# Patient Record
Sex: Male | Born: 1975 | Race: White | Hispanic: No | Marital: Married | Smoking: Current some day smoker
Health system: Southern US, Community
[De-identification: ages and names within clinical notes are randomized; demographics above are authoritative.]

## PROBLEM LIST (undated history)

## (undated) ENCOUNTER — Emergency Department (HOSPITAL_COMMUNITY): Payer: 59

## (undated) DIAGNOSIS — I10 Essential (primary) hypertension: Secondary | ICD-10-CM

---

## 2018-07-30 ENCOUNTER — Emergency Department (HOSPITAL_BASED_OUTPATIENT_CLINIC_OR_DEPARTMENT_OTHER)
Admission: EM | Admit: 2018-07-30 | Discharge: 2018-07-30 | Disposition: A | Discharge status: Home / Self Care | Payer: 59 | Attending: Emergency Medicine | Admitting: Emergency Medicine

## 2018-07-30 ENCOUNTER — Other Ambulatory Visit: Payer: Self-pay

## 2018-07-30 ENCOUNTER — Encounter (HOSPITAL_BASED_OUTPATIENT_CLINIC_OR_DEPARTMENT_OTHER): Payer: Self-pay | Admitting: *Deleted

## 2018-07-30 ENCOUNTER — Emergency Department (HOSPITAL_BASED_OUTPATIENT_CLINIC_OR_DEPARTMENT_OTHER): Payer: 59

## 2018-07-30 DIAGNOSIS — F17200 Nicotine dependence, unspecified, uncomplicated: Secondary | ICD-10-CM | POA: Insufficient documentation

## 2018-07-30 DIAGNOSIS — Z79899 Other long term (current) drug therapy: Secondary | ICD-10-CM | POA: Diagnosis not present

## 2018-07-30 DIAGNOSIS — B349 Viral infection, unspecified: Secondary | ICD-10-CM | POA: Diagnosis not present

## 2018-07-30 DIAGNOSIS — I10 Essential (primary) hypertension: Secondary | ICD-10-CM | POA: Diagnosis not present

## 2018-07-30 DIAGNOSIS — R112 Nausea with vomiting, unspecified: Secondary | ICD-10-CM | POA: Diagnosis present

## 2018-07-30 HISTORY — DX: Essential (primary) hypertension: I10

## 2018-07-30 LAB — CBC WITH DIFFERENTIAL/PLATELET
BASOS PCT: 0 %
Basophils Absolute: 0 10*3/uL (ref 0.0–0.1)
EOS ABS: 0.1 10*3/uL (ref 0.0–0.7)
Eosinophils Relative: 1 %
HEMATOCRIT: 43 % (ref 39.0–52.0)
Hemoglobin: 15.7 g/dL (ref 13.0–17.0)
LYMPHS ABS: 2.5 10*3/uL (ref 0.7–4.0)
Lymphocytes Relative: 20 %
MCH: 30.6 pg (ref 26.0–34.0)
MCHC: 36.5 g/dL — AB (ref 30.0–36.0)
MCV: 83.8 fL (ref 78.0–100.0)
MONO ABS: 0.7 10*3/uL (ref 0.1–1.0)
MONOS PCT: 5 %
NEUTROS ABS: 9.5 10*3/uL — AB (ref 1.7–7.7)
Neutrophils Relative %: 74 %
Platelets: 197 10*3/uL (ref 150–400)
RBC: 5.13 MIL/uL (ref 4.22–5.81)
RDW: 12.3 % (ref 11.5–15.5)
WBC: 12.8 10*3/uL — ABNORMAL HIGH (ref 4.0–10.5)

## 2018-07-30 LAB — COMPREHENSIVE METABOLIC PANEL
ALBUMIN: 4.6 g/dL (ref 3.5–5.0)
ALT: 35 U/L (ref 0–44)
AST: 24 U/L (ref 15–41)
Alkaline Phosphatase: 52 U/L (ref 38–126)
Anion gap: 10 (ref 5–15)
BUN: 14 mg/dL (ref 6–20)
CO2: 28 mmol/L (ref 22–32)
CREATININE: 0.86 mg/dL (ref 0.61–1.24)
Calcium: 9.1 mg/dL (ref 8.9–10.3)
Chloride: 99 mmol/L (ref 98–111)
GFR calc Af Amer: >60 mL/min (ref >60)
GFR calc non Af Amer: >60 mL/min (ref >60)
GLUCOSE: 89 mg/dL (ref 70–99)
Potassium: 4 mmol/L (ref 3.5–5.1)
Sodium: 137 mmol/L (ref 135–145)
Total Bilirubin: 0.9 mg/dL (ref 0.3–1.2)
Total Protein: 7.5 g/dL (ref 6.5–8.1)

## 2018-07-30 LAB — URINALYSIS, ROUTINE W REFLEX MICROSCOPIC
Bilirubin Urine: NEGATIVE
Glucose, UA: NEGATIVE mg/dL
HGB URINE DIPSTICK: NEGATIVE
KETONES UR: NEGATIVE mg/dL
Leukocytes, UA: NEGATIVE
NITRITE: NEGATIVE
PH: 7 (ref 5.0–8.0)
Protein, ur: NEGATIVE mg/dL
Specific Gravity, Urine: 1.015 (ref 1.005–1.030)

## 2018-07-30 LAB — I-STAT CG4 LACTIC ACID, ED: Lactic Acid, Venous: 0.82 mmol/L (ref 0.5–1.9)

## 2018-07-30 MED ORDER — HYDROCODONE-ACETAMINOPHEN 5-325 MG PO TABS: 1.0000 | ORAL_TABLET | Freq: Three times a day (TID) | ORAL | 0 refills | Status: DC | PRN

## 2018-07-30 MED ORDER — DOXYCYCLINE HYCLATE 100 MG PO CAPS: 100.0000 mg | ORAL_CAPSULE | Freq: Two times a day (BID) | ORAL | 0 refills | Status: AC

## 2018-07-30 MED ORDER — TRAMADOL HCL 50 MG PO TABS: 50.0000 mg | ORAL_TABLET | Freq: Three times a day (TID) | ORAL | 0 refills | Status: AC | PRN

## 2018-07-30 MED ORDER — KETOROLAC TROMETHAMINE 30 MG/ML IJ SOLN
30.0000 mg | Freq: Once | INTRAMUSCULAR | Status: AC
  Administered 2018-07-30: 30 mg via INTRAVENOUS
  Filled 2018-07-30: qty 1

## 2018-07-30 MED ORDER — DOXYCYCLINE HYCLATE 100 MG PO CAPS: 100.0000 mg | ORAL_CAPSULE | Freq: Two times a day (BID) | ORAL | 0 refills | Status: DC

## 2018-07-30 MED ORDER — SODIUM CHLORIDE 0.9 % IV BOLUS
500.0000 mL | Freq: Once | INTRAVENOUS | Status: AC
  Administered 2018-07-30: 500 mL via INTRAVENOUS

## 2018-07-30 NOTE — ED Provider Notes (Signed)
[facility] EMERGENCY DEPARTMENT Provider Note   CSN: [id number] Arrival date & time: 07/30/18  1716     History   Chief Complaint Chief Complaint  Patient presents with  . Generalized Body Aches    HPI David Osborne is a 42 y.o. male.  Patient reports generalized body aches began yesterday, along with feeling hot with chills. Diarrhea last night.  Nausea today, with two episodes of vomiting this morning. Mild headache.  He also reports getting bit on the lower legs by unknown/unseen insect after putting on a pair of long pants.   The history is provided by the patient. No language interpreter was used.  Illness  This is a new problem. The current episode started yesterday. The problem has been gradually worsening. Associated symptoms include headaches. Pertinent negatives include no abdominal pain. Treatments tried: NSAID. The treatment provided no relief.    Past Medical History:  Diagnosis Date  . Hypertension     There are no active problems to display for this patient.   History reviewed. No pertinent surgical history.      Home Medications    Prior to Admission medications   Medication Sig Start Date End Date Taking? Authorizing Provider  LISINOPRIL PO Take by mouth.   Yes [provider]    Family History No family history on file.  Social History Social History   Tobacco Use  . Smoking status: Current Some Day Smoker  . Smokeless tobacco: Never Used  Substance Use Topics  . Alcohol use: Yes  . Drug use: Never     Allergies   Patient has no known allergies.   Review of Systems Review of Systems  Gastrointestinal: Negative for abdominal pain.  Musculoskeletal: Positive for arthralgias and myalgias.  Neurological: Positive for headaches.  All other systems reviewed and are negative.    Physical Exam Updated Vital Signs BP (!) 174/110 (BP Location: Right Arm)   Pulse 66   Temp 98.1 F (36.7 C) (Oral)   Resp 18    Ht 5\' 5"  (1.651 m)   Wt 83.5 kg   SpO2 99%   BMI 30.62 kg/m   Physical Exam  Constitutional: He is oriented to person, place, and time. He appears well-developed and well-nourished.  HENT:  Head: Normocephalic.  Eyes: Conjunctivae are normal.  Neck: Neck supple.  Cardiovascular: Normal rate and regular rhythm.  Pulmonary/Chest: Effort normal and breath sounds normal.  Abdominal: Soft. Bowel sounds are normal.  Musculoskeletal: Normal range of motion.  Neurological: He is alert and oriented to person, place, and time.  Skin: Skin is warm and dry. Rash noted. Rash is papular. There is erythema.  Multiple areas of papular lesions with erythema of lower extremities, below knees, that are pruritic.   Nursing note and vitals reviewed.    ED Treatments / Results  Labs (all labs ordered are listed, but only abnormal results are displayed) Labs Reviewed  CBC WITH DIFFERENTIAL/PLATELET - Abnormal; Notable for the following components:      Result Value   WBC 12.8 (*)    MCHC 36.5 (*)    Neutro Abs 9.5 (*)    All other components within normal limits  URINALYSIS, ROUTINE W REFLEX MICROSCOPIC  COMPREHENSIVE METABOLIC PANEL  I-STAT CG4 LACTIC ACID, ED    EKG None  Radiology No results found.  Procedures Procedures (including critical care time)  Medications Ordered in ED Medications - No data to display   Initial Impression / Assessment and Plan / ED Course  I have reviewed the triage vital signs and the nursing notes.  Pertinent labs & imaging results that were available during my care of the patient were reviewed by me and considered in my medical decision making (see chart for details).     Pt symptoms consistent with viral illness. Pt will be discharged with symptomatic treatment. Will also cover for potential tick borne disease with doxycycline. No current neurologic findings. No indication of sepsis. Discussed return precautions.  Pt is hemodynamically stable & in  NAD prior to discharge.  [place] Narcotic database consulted--patient not found in system.  Patient noted to be hypertensive in the emergency department.  No signs of hypertensive urgency.  Discussed with patient the need for close follow-up and management by their primary care physician.  Final Clinical Impressions(s) / ED Diagnoses   Final diagnoses:  Viral illness    ED Discharge Orders         Ordered    doxycycline (VIBRAMYCIN) 100 MG capsule  2 times daily,   Status:  Discontinued     07/30/18 2010    HYDROcodone-acetaminophen (NORCO) 5-325 MG tablet  Every 8 hours PRN,   Status:  Discontinued     07/30/18 2010    doxycycline (VIBRAMYCIN) 100 MG capsule  2 times daily     07/30/18 2046    traMADol (ULTRAM) 50 MG tablet  Every 8 hours PRN     07/30/18 2046           Felicie MornSmith, Kawanna Christley, NP 07/31/18 0011    Tilden Fossaees, Elizabeth, MD 08/01/18 1520

## 2018-07-30 NOTE — Discharge Instructions (Addendum)
Combine tylenol, 1000 mg, with ibuprofen, 600 mg, every 8 hours for pain/discomfort. You also have a prescription for tramadol--reserve this medication for pain not controlled with the combination therapy. You have also been started on doxycycline, 100 mg two times per day, for potential exposure to tick borne illness.

## 2018-07-30 NOTE — ED Notes (Signed)
Pt. Reports feeling pain in joints and muscles yesterday with vomiting and diarrhea yesterday last night into early this morning.  Pt. Reports rash around his ankles today and some itching in the ankle area.  noteded some red rash around the ankles bilat.  Pt. Is staying at a hotel and reports no visual seeing of bugs.

## 2018-07-30 NOTE — ED Triage Notes (Addendum)
Generalized body aches x 2 days. Last night he had chills and fever. Headache. Diarrhea last night. Vomited x 2 this am. Nausea.  He is here working. Last night he got several mosquito bites and bug bites on his legs.

## 2020-01-07 IMAGING — DX DG CHEST 2V
2 series · 2 of 2 positions shown · non-contrast
Comparison: None.

CLINICAL DATA: Fever and body aches

EXAM:
CHEST - 2 VIEW

[chest pa]
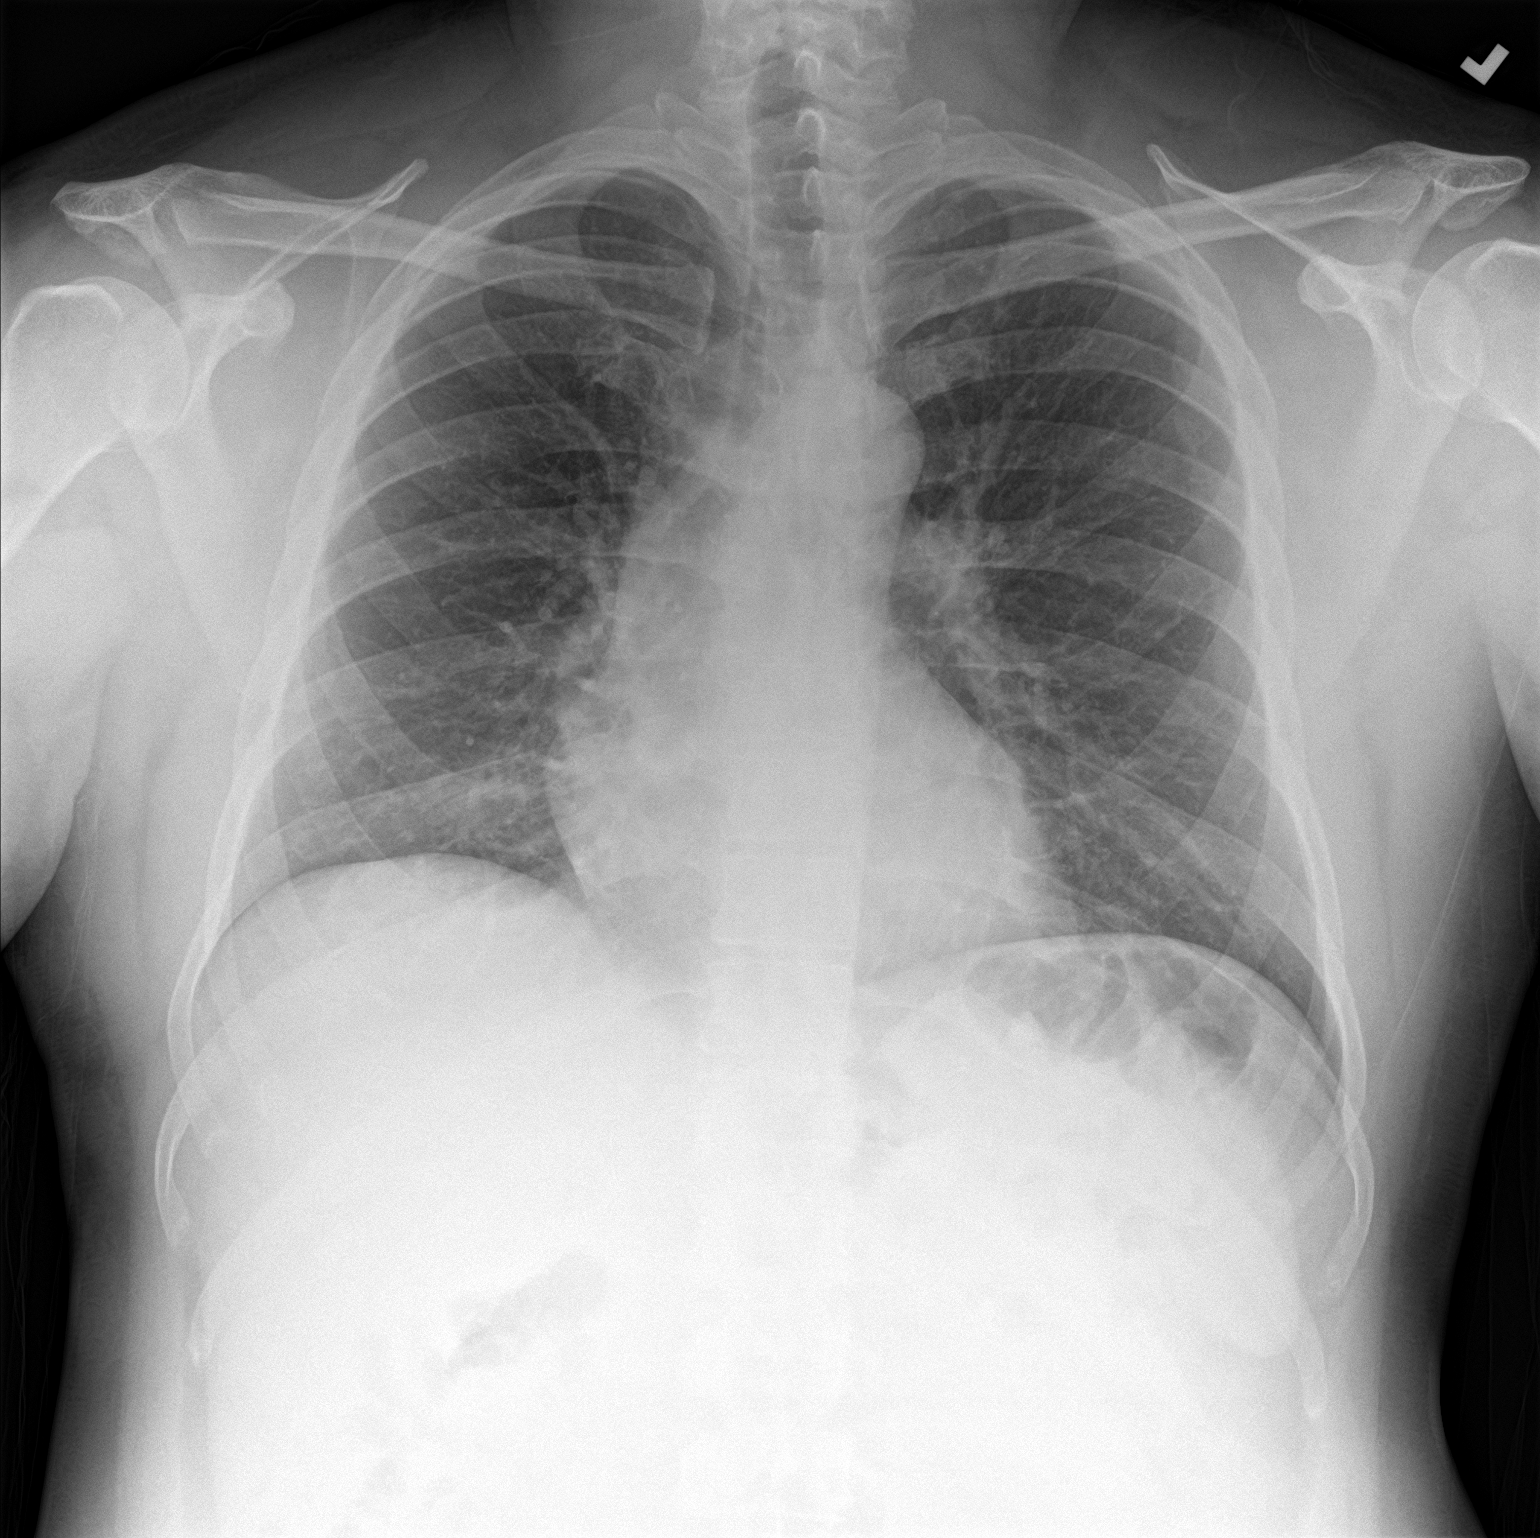

[chest lat]
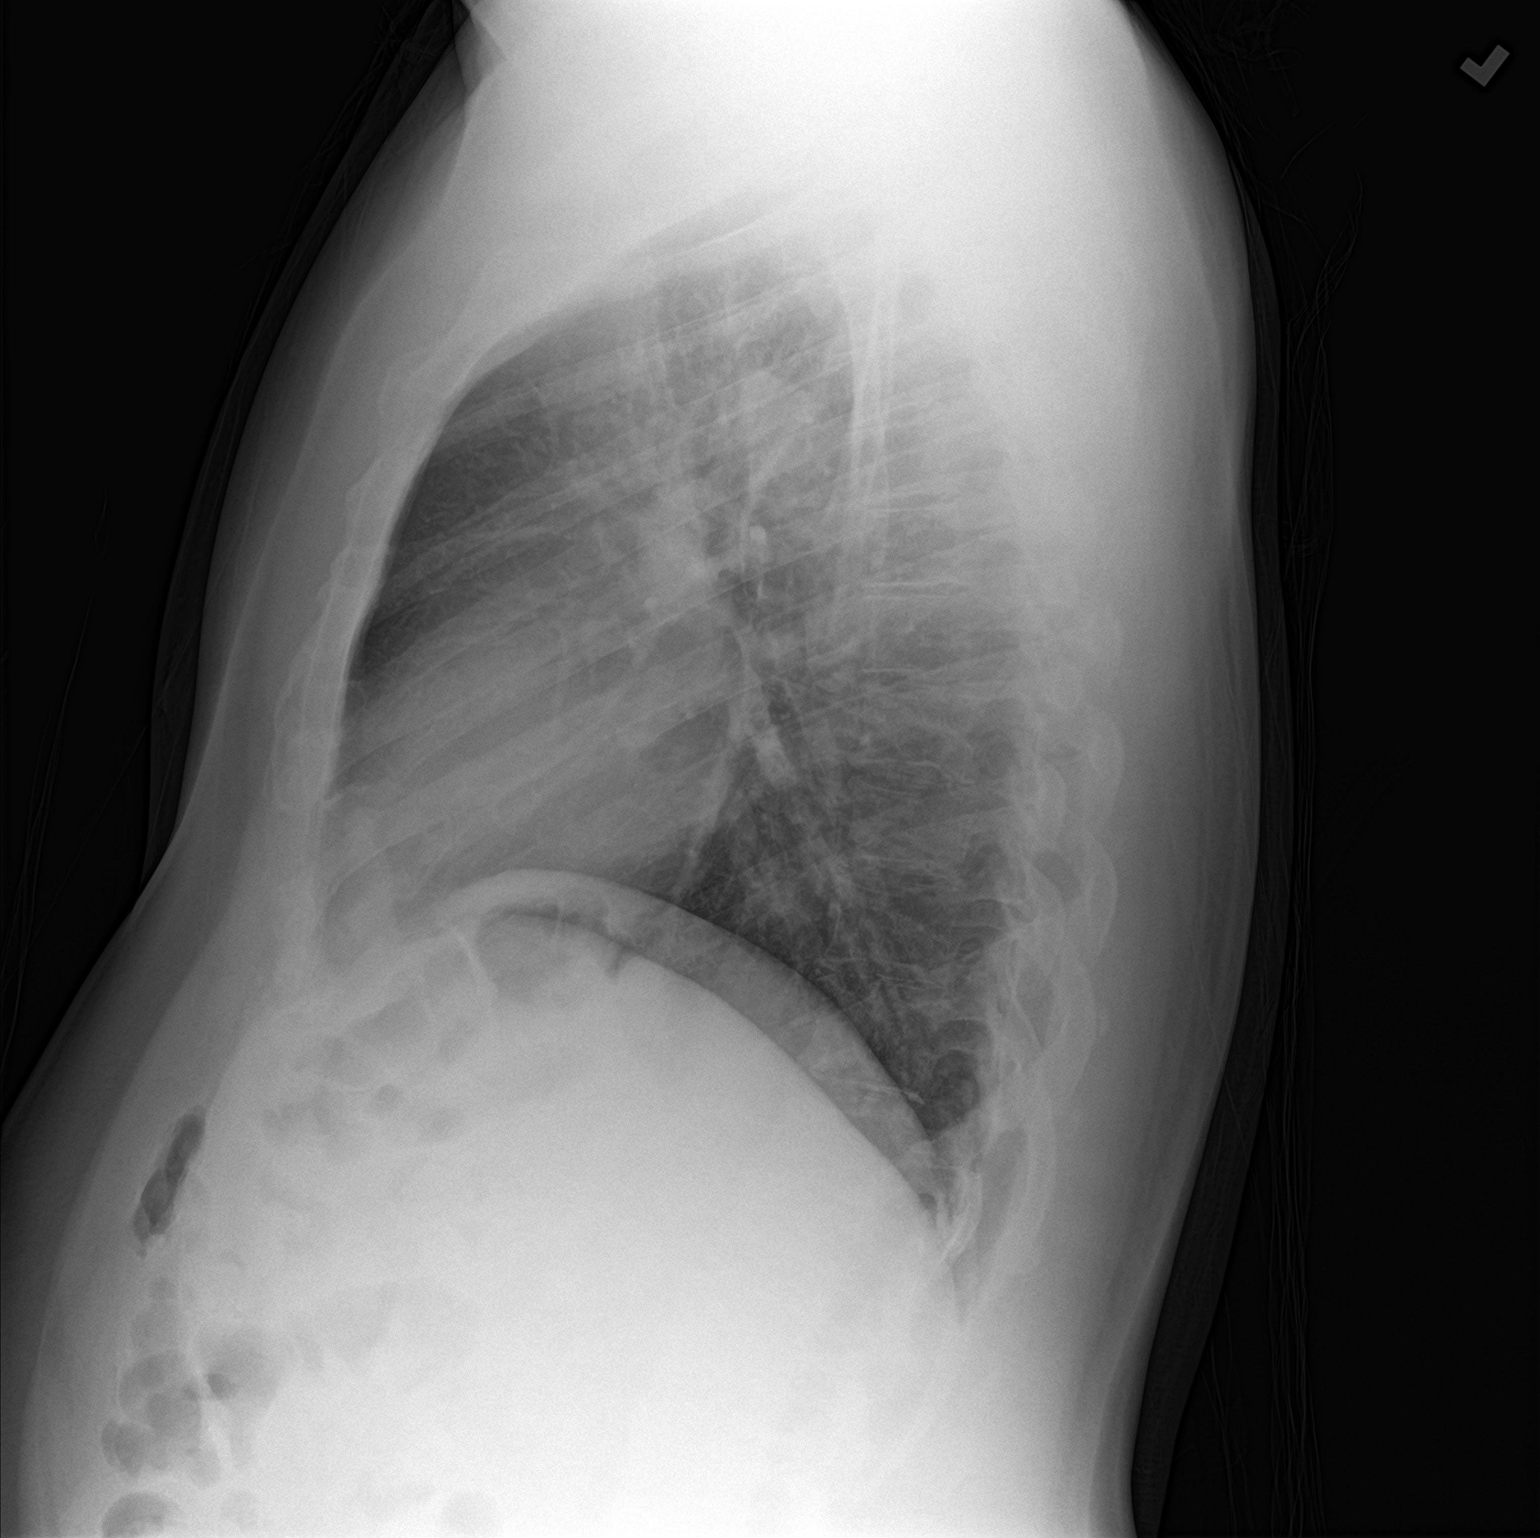

[2 of 2 positions shown; findings below may reference images not displayed]

FINDINGS: The heart size and mediastinal contours are within normal limits.
Both lungs are clear. The visualized skeletal structures are
unremarkable.
IMPRESSION: No active cardiopulmonary disease.
